# Patient Record
Sex: Female | Born: 1950 | Race: Asian | Hispanic: No | Marital: Single
Health system: Southern US, Community
[De-identification: ages and names within clinical notes are randomized; demographics above are authoritative.]

## PROBLEM LIST (undated history)

## (undated) DIAGNOSIS — I10 Essential (primary) hypertension: Secondary | ICD-10-CM

---

## 2007-11-05 ENCOUNTER — Emergency Department (HOSPITAL_COMMUNITY): Admission: EM | Admit: 2007-11-05 | Discharge: 2007-11-05 | Payer: Self-pay | Admitting: Emergency Medicine

## 2007-11-06 ENCOUNTER — Ambulatory Visit: Payer: Self-pay | Admitting: *Deleted

## 2007-12-30 ENCOUNTER — Ambulatory Visit: Payer: Self-pay | Admitting: Internal Medicine

## 2007-12-30 LAB — CONVERTED CEMR LAB
BUN: 16 mg/dL (ref 6–23)
Chloride: 103 meq/L (ref 96–112)
Creatinine, Ser: 0.59 mg/dL (ref 0.40–1.20)
Glucose, Bld: 93 mg/dL (ref 70–99)

## 2008-04-05 ENCOUNTER — Encounter (INDEPENDENT_AMBULATORY_CARE_PROVIDER_SITE_OTHER): Payer: Self-pay | Admitting: Family Medicine

## 2008-04-05 ENCOUNTER — Ambulatory Visit: Payer: Self-pay | Admitting: Internal Medicine

## 2008-04-27 ENCOUNTER — Ambulatory Visit (HOSPITAL_COMMUNITY): Admission: RE | Admit: 2008-04-27 | Discharge: 2008-04-27 | Payer: Self-pay | Admitting: Internal Medicine

## 2008-05-11 ENCOUNTER — Encounter: Admission: RE | Admit: 2008-05-11 | Discharge: 2008-05-11 | Payer: Self-pay | Admitting: Family Medicine

## 2008-07-05 ENCOUNTER — Ambulatory Visit: Payer: Self-pay | Admitting: Family Medicine

## 2008-07-05 LAB — CONVERTED CEMR LAB
ALT: <8 units/L (ref 0–35)
AST: 20 units/L (ref 0–37)
Albumin: 4.7 g/dL (ref 3.5–5.2)
Alkaline Phosphatase: 58 units/L (ref 39–117)
BUN: 12 mg/dL (ref 6–23)
Basophils Relative: 0 % (ref 0–1)
Calcium: 9.4 mg/dL (ref 8.4–10.5)
Chloride: 104 meq/L (ref 96–112)
Free T4: 1 ng/dL (ref 0.89–1.80)
HDL: 55 mg/dL (ref >39)
LDL Cholesterol: 89 mg/dL (ref 0–99)
MCHC: 32.3 g/dL (ref 30.0–36.0)
Monocytes Relative: 8 % (ref 3–12)
Neutro Abs: 3.5 10*3/uL (ref 1.7–7.7)
Neutrophils Relative %: 69 % (ref 43–77)
Platelets: 215 10*3/uL (ref 150–400)
Potassium: 4.1 meq/L (ref 3.5–5.3)
RBC: 4.7 M/uL (ref 3.87–5.11)
Sodium: 138 meq/L (ref 135–145)
TSH: 1.248 microintl units/mL (ref 0.350–4.50)
Total Protein: 7.7 g/dL (ref 6.0–8.3)
WBC: 5 10*3/uL (ref 4.0–10.5)

## 2008-07-20 ENCOUNTER — Emergency Department (HOSPITAL_COMMUNITY): Admission: EM | Admit: 2008-07-20 | Discharge: 2008-07-20 | Payer: Self-pay | Admitting: Family Medicine

## 2008-08-05 ENCOUNTER — Emergency Department (HOSPITAL_COMMUNITY): Admission: EM | Admit: 2008-08-05 | Discharge: 2008-08-05 | Payer: Self-pay | Admitting: Emergency Medicine

## 2008-10-20 ENCOUNTER — Encounter: Admission: RE | Admit: 2008-10-20 | Discharge: 2008-10-20 | Payer: Self-pay | Admitting: Family Medicine

## 2008-10-25 ENCOUNTER — Ambulatory Visit: Payer: Self-pay | Admitting: Family Medicine

## 2009-04-28 ENCOUNTER — Encounter: Admission: RE | Admit: 2009-04-28 | Discharge: 2009-04-28 | Payer: Self-pay | Admitting: Family Medicine

## 2009-10-18 ENCOUNTER — Encounter: Admission: RE | Admit: 2009-10-18 | Discharge: 2009-10-18 | Payer: Self-pay | Admitting: Family Medicine

## 2010-05-25 ENCOUNTER — Encounter
Admission: RE | Admit: 2010-05-25 | Discharge: 2010-05-25 | Payer: Self-pay | Source: Home / Self Care | Attending: Family Medicine | Admitting: Family Medicine

## 2010-10-02 LAB — DIFFERENTIAL
Lymphocytes Relative: 20 % (ref 12–46)
Monocytes Absolute: 0.6 10*3/uL (ref 0.1–1.0)
Monocytes Relative: 8 % (ref 3–12)
Neutro Abs: 5 10*3/uL (ref 1.7–7.7)
Neutrophils Relative %: 71 % (ref 43–77)

## 2010-10-02 LAB — CBC
Hemoglobin: 12.7 g/dL (ref 12.0–15.0)
RBC: 4.36 MIL/uL (ref 3.87–5.11)
WBC: 7 10*3/uL (ref 4.0–10.5)

## 2011-04-26 ENCOUNTER — Other Ambulatory Visit: Payer: Self-pay | Admitting: Family Medicine

## 2011-04-26 DIAGNOSIS — Z1231 Encounter for screening mammogram for malignant neoplasm of breast: Secondary | ICD-10-CM

## 2011-05-29 ENCOUNTER — Ambulatory Visit
Admission: RE | Admit: 2011-05-29 | Discharge: 2011-05-29 | Disposition: A | Discharge status: Home / Self Care | Payer: Self-pay | Source: Ambulatory Visit | Attending: Family Medicine | Admitting: Family Medicine

## 2011-05-29 DIAGNOSIS — Z1231 Encounter for screening mammogram for malignant neoplasm of breast: Secondary | ICD-10-CM

## 2011-12-30 ENCOUNTER — Encounter (HOSPITAL_COMMUNITY): Payer: Self-pay | Admitting: Emergency Medicine

## 2011-12-30 ENCOUNTER — Emergency Department (HOSPITAL_COMMUNITY)
Admission: EM | Admit: 2011-12-30 | Discharge: 2011-12-30 | Disposition: A | Discharge status: Home / Self Care | Payer: Self-pay | Source: Home / Self Care | Attending: Family Medicine | Admitting: Family Medicine

## 2011-12-30 DIAGNOSIS — L219 Seborrheic dermatitis, unspecified: Secondary | ICD-10-CM

## 2011-12-30 DIAGNOSIS — L218 Other seborrheic dermatitis: Secondary | ICD-10-CM

## 2011-12-30 DIAGNOSIS — H8110 Benign paroxysmal vertigo, unspecified ear: Secondary | ICD-10-CM

## 2011-12-30 HISTORY — DX: Essential (primary) hypertension: I10

## 2011-12-30 LAB — POCT URINALYSIS DIP (DEVICE)
Bilirubin Urine: NEGATIVE
Ketones, ur: NEGATIVE mg/dL
Leukocytes, UA: NEGATIVE
Protein, ur: NEGATIVE mg/dL
Specific Gravity, Urine: 1.015 (ref 1.005–1.030)
pH: 7 (ref 5.0–8.0)

## 2011-12-30 LAB — POCT I-STAT, CHEM 8
BUN: 7 mg/dL (ref 6–23)
Calcium, Ion: 1.27 mmol/L — ABNORMAL HIGH (ref 1.12–1.23)
Chloride: 105 mEq/L (ref 96–112)
Creatinine, Ser: 0.8 mg/dL (ref 0.50–1.10)
Sodium: 142 mEq/L (ref 135–145)
TCO2: 25 mmol/L (ref 0–100)

## 2011-12-30 MED ORDER — SALICYLIC ACID 0.5 % EX SOLN: CUTANEOUS | Status: DC

## 2011-12-30 MED ORDER — MECLIZINE HCL 50 MG PO TABS: 50.0000 mg | ORAL_TABLET | Freq: Three times a day (TID) | ORAL | Status: AC | PRN

## 2011-12-30 MED ORDER — TRIAMCINOLONE ACETONIDE 0.025 % EX LOTN: TOPICAL_LOTION | CUTANEOUS | Status: DC

## 2011-12-30 NOTE — ED Notes (Addendum)
Pt brought here by daughter per vietnamese language with c/o 2 weeks dizziness followed by near faint without pain.states I feel like my body is very weak.denies n/v.hx htn.orthostats neg.

## 2011-12-30 NOTE — ED Provider Notes (Signed)
History     CSN: [id number]  Arrival date & time 12/30/11  1059   First MD Initiated Contact with Patient 12/30/11 1104      Chief Complaint  Patient presents with  . Dizziness    (Consider location/radiation/quality/duration/timing/severity/associated sxs/prior treatment) HPI Comments: 61 year old female with history of high blood pressure. Here complaining of 3 weeks history of episodes of dizziness and room spinning around her associated with heaviness sensation in her ears/head. Denies nausea or vomiting. Reports that had a similar episode 3 years ago that self resolve in few days. Denies any focal extremity weakness.  felt unbalanced during dizziness episodes having to lean on the wall, but denies falls. No polyuria, polydipsia or polyphagia. States that she took her blood pressure medication early this morning does not know the name of the medication. Denies leg cramping. No visual changes above base line for her. Has not taken any medication for her symptoms. Symptoms occurring several times a day (more than 3) in the last 3 weeks. No fever or chills. No low extremity edema. No palpitations or syncope. No shortness of breath or chest pain. Also has itchy rash in scalp that wants to have checked. Intermittent for years.   Past Medical History  Diagnosis Date  . Hypertension     History reviewed. No pertinent past surgical history.  No family history on file.  History  Substance Use Topics  . Smoking status: Not on file  . Smokeless tobacco: Not on file  . Alcohol Use:     OB History    Grav Para Term Preterm Abortions TAB SAB Ect Mult Living                  Review of Systems  Constitutional: Negative for fever, chills, diaphoresis, appetite change, fatigue and unexpected weight change.  HENT: Negative for hearing loss, ear pain, congestion, sore throat, facial swelling, rhinorrhea, sneezing, trouble swallowing, neck pain, neck stiffness, voice change and ear  discharge.   Cardiovascular: Negative for chest pain, palpitations and leg swelling.  Gastrointestinal: Negative for nausea, vomiting, abdominal pain and diarrhea.  Genitourinary: Negative for dysuria and frequency.  Musculoskeletal: Negative for myalgias, joint swelling and arthralgias.  Skin: Positive for rash.  Neurological: Positive for dizziness. Negative for tremors, seizures, syncope, speech difficulty, weakness, numbness and headaches.    Allergies  Review of patient's allergies indicates no known allergies.  Home Medications   Current Outpatient Rx  Name Route Sig Dispense Refill  . MECLIZINE HCL 50 MG PO TABS Oral Take 1 tablet (50 mg total) by mouth 3 (three) times daily as needed. 30 tablet 0  . SALICYLIC ACID 0.5 % EX SOLN  Apply to the scalp as instructed. 1 Bottle 0  . TRIAMCINOLONE ACETONIDE 0.025 % EX LOTN  Apply to scalp as instructed. 1 Bottle 0    BP 119/76  Pulse 83  Temp 98.6 F (37 C) (Oral)  Resp 16  SpO2 98%  Physical Exam  Nursing note and vitals reviewed. Constitutional: She is oriented to person, place, and time. She appears well-developed and well-nourished. No distress.  HENT:  Head: Normocephalic and atraumatic.  Right Ear: External ear normal.  Left Ear: External ear normal.  Nose: Nose normal.  Mouth/Throat: Oropharynx is clear and moist. No oropharyngeal exudate.  Eyes: Conjunctivae and EOM are normal. Pupils are equal, round, and reactive to light. No scleral icterus.  Neck: Normal range of motion. Neck supple. No thyromegaly present.  Cardiovascular: Normal rate, regular rhythm, normal  heart sounds and intact distal pulses.  Exam reveals no gallop and no friction rub.   No murmur heard. Pulmonary/Chest: Effort normal and breath sounds normal. No respiratory distress. She has no wheezes. She has no rales. She exhibits no tenderness.  Abdominal: Soft. Bowel sounds are normal. She exhibits no distension and no mass. There is no tenderness.  There is no rebound and no guarding.  Lymphadenopathy:    She has no cervical adenopathy.  Neurological: She is alert and oriented to person, place, and time. She has normal strength and normal reflexes. No cranial nerve deficit or sensory deficit. She displays a negative Romberg sign. Coordination and gait normal.       No facial or arm drop. Central tongue. Visual fields normal by comparison. Hallpike maneuver elicited symptoms, including short lived horizontal nystagmus.   Skin:       scaly rash in scalp worse in hair line borders and around ears. Consistent with seborrheic dermatitis. No pustules or signs of over infection.    ED Course  Procedures (including critical care time)  Labs Reviewed  POCT I-STAT, CHEM 8 - Abnormal; Notable for the following:    Glucose, Bld 120 (*)     Calcium, Ion 1.27 (*)     All other components within normal limits  POCT URINALYSIS DIP (DEVICE)  LAB REPORT - SCANNED   No results found.   1. Benign positional vertigo   2. Seborrheic dermatitis of scalp       MDM  Normal glucose and electrolytes. Non-orthostatic.  EKG: Normal sinus rhythm. Rate in the 70s. No arrhythmias or ischemic changes. Clinically well. With normal gross neurologic examination. Reassuring exam and symptoms triggered by Hallpike maneuver. Impress positional vertigo. Treated with meclizine. Prescribed salicylic acid and triamcinolone lotion for head rash. Also asked patient to followup with her primary care provider to monitor her symptoms and determine if any further workup is required.         Sharin Grave, MD 01/02/12 [phone]

## 2012-01-06 ENCOUNTER — Telehealth (HOSPITAL_COMMUNITY): Payer: Self-pay | Admitting: *Deleted

## 2012-01-06 NOTE — ED Notes (Signed)
Morrie Sheldon at [facility] pharmacy on [address] called and said they do not carry Salicylic Acid 0.5%.  They can order 6% or they have OTC shampoo that is 3 %.  Discussed with Dr. Tressia Danas when she came in and she said she wants the 6% Salicylic Acid 1 bottle. She thinks it might be 60 ml.  I called Morrie Sheldon back and gave her this information. Vassie Moselle 01/06/2012

## 2014-07-01 ENCOUNTER — Other Ambulatory Visit: Payer: Self-pay

## 2014-07-01 DIAGNOSIS — Z1231 Encounter for screening mammogram for malignant neoplasm of breast: Secondary | ICD-10-CM

## 2014-08-17 ENCOUNTER — Ambulatory Visit
Admission: RE | Admit: 2014-08-17 | Discharge: 2014-08-17 | Disposition: A | Discharge status: Home / Self Care | Payer: No Typology Code available for payment source | Source: Ambulatory Visit

## 2014-08-17 DIAGNOSIS — Z1231 Encounter for screening mammogram for malignant neoplasm of breast: Secondary | ICD-10-CM

## 2015-07-06 ENCOUNTER — Other Ambulatory Visit: Payer: Self-pay

## 2015-07-06 DIAGNOSIS — Z1231 Encounter for screening mammogram for malignant neoplasm of breast: Secondary | ICD-10-CM

## 2015-07-18 ENCOUNTER — Ambulatory Visit
Admission: RE | Admit: 2015-07-18 | Discharge: 2015-07-18 | Disposition: A | Discharge status: Home / Self Care | Payer: No Typology Code available for payment source | Source: Ambulatory Visit

## 2015-07-18 DIAGNOSIS — Z1231 Encounter for screening mammogram for malignant neoplasm of breast: Secondary | ICD-10-CM

## 2016-07-12 ENCOUNTER — Other Ambulatory Visit (HOSPITAL_COMMUNITY): Payer: Self-pay | Admitting: Internal Medicine

## 2016-07-12 ENCOUNTER — Ambulatory Visit (HOSPITAL_COMMUNITY)
Admission: RE | Admit: 2016-07-12 | Discharge: 2016-07-12 | Disposition: A | Discharge status: Home / Self Care | Payer: Self-pay | Source: Ambulatory Visit | Attending: Internal Medicine | Admitting: Internal Medicine

## 2016-07-12 DIAGNOSIS — M5441 Lumbago with sciatica, right side: Secondary | ICD-10-CM | POA: Insufficient documentation

## 2016-07-12 DIAGNOSIS — M4186 Other forms of scoliosis, lumbar region: Secondary | ICD-10-CM | POA: Insufficient documentation

## 2016-07-12 DIAGNOSIS — M5442 Lumbago with sciatica, left side: Secondary | ICD-10-CM

## 2016-07-12 DIAGNOSIS — G8929 Other chronic pain: Secondary | ICD-10-CM

## 2016-07-12 DIAGNOSIS — W19XXXA Unspecified fall, initial encounter: Secondary | ICD-10-CM | POA: Insufficient documentation

## 2016-07-12 DIAGNOSIS — S3992XA Unspecified injury of lower back, initial encounter: Secondary | ICD-10-CM | POA: Insufficient documentation

## 2017-09-05 DIAGNOSIS — H2513 Age-related nuclear cataract, bilateral: Secondary | ICD-10-CM | POA: Diagnosis not present

## 2017-09-05 DIAGNOSIS — H40033 Anatomical narrow angle, bilateral: Secondary | ICD-10-CM | POA: Diagnosis not present

## 2017-10-10 DIAGNOSIS — E785 Hyperlipidemia, unspecified: Secondary | ICD-10-CM | POA: Diagnosis not present

## 2017-10-10 DIAGNOSIS — N898 Other specified noninflammatory disorders of vagina: Secondary | ICD-10-CM | POA: Diagnosis not present

## 2017-10-10 DIAGNOSIS — I10 Essential (primary) hypertension: Secondary | ICD-10-CM | POA: Diagnosis not present

## 2017-10-10 DIAGNOSIS — Z1211 Encounter for screening for malignant neoplasm of colon: Secondary | ICD-10-CM | POA: Diagnosis not present

## 2017-10-10 DIAGNOSIS — E1165 Type 2 diabetes mellitus with hyperglycemia: Secondary | ICD-10-CM | POA: Diagnosis not present

## 2017-10-13 DIAGNOSIS — Z1211 Encounter for screening for malignant neoplasm of colon: Secondary | ICD-10-CM | POA: Diagnosis not present

## 2017-11-20 DIAGNOSIS — E785 Hyperlipidemia, unspecified: Secondary | ICD-10-CM | POA: Diagnosis not present

## 2017-11-20 DIAGNOSIS — L309 Dermatitis, unspecified: Secondary | ICD-10-CM | POA: Diagnosis not present

## 2017-11-20 DIAGNOSIS — K219 Gastro-esophageal reflux disease without esophagitis: Secondary | ICD-10-CM | POA: Diagnosis not present

## 2017-11-20 DIAGNOSIS — I10 Essential (primary) hypertension: Secondary | ICD-10-CM | POA: Diagnosis not present

## 2017-12-25 DIAGNOSIS — E785 Hyperlipidemia, unspecified: Secondary | ICD-10-CM | POA: Diagnosis not present

## 2017-12-25 DIAGNOSIS — Z0000 Encounter for general adult medical examination without abnormal findings: Secondary | ICD-10-CM | POA: Diagnosis not present

## 2017-12-25 DIAGNOSIS — I10 Essential (primary) hypertension: Secondary | ICD-10-CM | POA: Diagnosis not present

## 2017-12-25 DIAGNOSIS — L309 Dermatitis, unspecified: Secondary | ICD-10-CM | POA: Diagnosis not present

## 2017-12-25 DIAGNOSIS — K219 Gastro-esophageal reflux disease without esophagitis: Secondary | ICD-10-CM | POA: Diagnosis not present

## 2018-01-01 DIAGNOSIS — K219 Gastro-esophageal reflux disease without esophagitis: Secondary | ICD-10-CM | POA: Diagnosis not present

## 2018-01-01 DIAGNOSIS — L309 Dermatitis, unspecified: Secondary | ICD-10-CM | POA: Diagnosis not present

## 2018-01-01 DIAGNOSIS — Z23 Encounter for immunization: Secondary | ICD-10-CM | POA: Diagnosis not present

## 2018-01-01 DIAGNOSIS — I10 Essential (primary) hypertension: Secondary | ICD-10-CM | POA: Diagnosis not present

## 2018-01-01 DIAGNOSIS — E785 Hyperlipidemia, unspecified: Secondary | ICD-10-CM | POA: Diagnosis not present

## 2018-03-13 DIAGNOSIS — R29898 Other symptoms and signs involving the musculoskeletal system: Secondary | ICD-10-CM | POA: Diagnosis not present

## 2018-03-13 DIAGNOSIS — Z23 Encounter for immunization: Secondary | ICD-10-CM | POA: Diagnosis not present

## 2018-03-13 DIAGNOSIS — M79644 Pain in right finger(s): Secondary | ICD-10-CM | POA: Diagnosis not present

## 2018-03-13 DIAGNOSIS — M79675 Pain in left toe(s): Secondary | ICD-10-CM | POA: Diagnosis not present

## 2018-03-13 DIAGNOSIS — M79674 Pain in right toe(s): Secondary | ICD-10-CM | POA: Diagnosis not present

## 2018-03-13 DIAGNOSIS — M79645 Pain in left finger(s): Secondary | ICD-10-CM | POA: Diagnosis not present

## 2018-03-13 DIAGNOSIS — R12 Heartburn: Secondary | ICD-10-CM | POA: Diagnosis not present

## 2018-03-19 DIAGNOSIS — M25552 Pain in left hip: Secondary | ICD-10-CM | POA: Diagnosis not present

## 2018-03-19 DIAGNOSIS — M1611 Unilateral primary osteoarthritis, right hip: Secondary | ICD-10-CM | POA: Diagnosis not present

## 2018-03-19 DIAGNOSIS — M19042 Primary osteoarthritis, left hand: Secondary | ICD-10-CM | POA: Diagnosis not present

## 2018-03-19 DIAGNOSIS — M533 Sacrococcygeal disorders, not elsewhere classified: Secondary | ICD-10-CM | POA: Diagnosis not present

## 2018-03-19 DIAGNOSIS — M419 Scoliosis, unspecified: Secondary | ICD-10-CM | POA: Diagnosis not present

## 2018-03-19 DIAGNOSIS — M79671 Pain in right foot: Secondary | ICD-10-CM | POA: Diagnosis not present

## 2018-03-19 DIAGNOSIS — L409 Psoriasis, unspecified: Secondary | ICD-10-CM | POA: Diagnosis not present

## 2018-03-19 DIAGNOSIS — M79642 Pain in left hand: Secondary | ICD-10-CM | POA: Diagnosis not present

## 2018-03-19 DIAGNOSIS — M810 Age-related osteoporosis without current pathological fracture: Secondary | ICD-10-CM | POA: Diagnosis not present

## 2018-03-19 DIAGNOSIS — M064 Inflammatory polyarthropathy: Secondary | ICD-10-CM | POA: Diagnosis not present

## 2018-03-19 DIAGNOSIS — M79643 Pain in unspecified hand: Secondary | ICD-10-CM | POA: Diagnosis not present

## 2018-03-19 DIAGNOSIS — M19041 Primary osteoarthritis, right hand: Secondary | ICD-10-CM | POA: Diagnosis not present

## 2018-03-19 DIAGNOSIS — M8580 Other specified disorders of bone density and structure, unspecified site: Secondary | ICD-10-CM | POA: Diagnosis not present

## 2018-03-19 DIAGNOSIS — M79641 Pain in right hand: Secondary | ICD-10-CM | POA: Diagnosis not present

## 2018-03-19 DIAGNOSIS — M19072 Primary osteoarthritis, left ankle and foot: Secondary | ICD-10-CM | POA: Diagnosis not present

## 2018-03-19 DIAGNOSIS — E119 Type 2 diabetes mellitus without complications: Secondary | ICD-10-CM | POA: Diagnosis not present

## 2018-03-19 DIAGNOSIS — M1612 Unilateral primary osteoarthritis, left hip: Secondary | ICD-10-CM | POA: Diagnosis not present

## 2018-03-19 DIAGNOSIS — M19071 Primary osteoarthritis, right ankle and foot: Secondary | ICD-10-CM | POA: Diagnosis not present

## 2018-03-19 DIAGNOSIS — L4050 Arthropathic psoriasis, unspecified: Secondary | ICD-10-CM | POA: Diagnosis not present

## 2018-03-20 DIAGNOSIS — M06871 Other specified rheumatoid arthritis, right ankle and foot: Secondary | ICD-10-CM | POA: Diagnosis not present

## 2018-03-20 DIAGNOSIS — M06872 Other specified rheumatoid arthritis, left ankle and foot: Secondary | ICD-10-CM | POA: Diagnosis not present

## 2018-04-28 DIAGNOSIS — E119 Type 2 diabetes mellitus without complications: Secondary | ICD-10-CM | POA: Diagnosis not present

## 2018-04-28 DIAGNOSIS — M810 Age-related osteoporosis without current pathological fracture: Secondary | ICD-10-CM | POA: Diagnosis not present

## 2018-04-28 DIAGNOSIS — M064 Inflammatory polyarthropathy: Secondary | ICD-10-CM | POA: Diagnosis not present

## 2018-04-28 DIAGNOSIS — M79671 Pain in right foot: Secondary | ICD-10-CM | POA: Diagnosis not present

## 2018-04-28 DIAGNOSIS — M47816 Spondylosis without myelopathy or radiculopathy, lumbar region: Secondary | ICD-10-CM | POA: Diagnosis not present

## 2018-04-28 DIAGNOSIS — M79643 Pain in unspecified hand: Secondary | ICD-10-CM | POA: Diagnosis not present

## 2018-04-28 DIAGNOSIS — L409 Psoriasis, unspecified: Secondary | ICD-10-CM | POA: Diagnosis not present

## 2018-04-28 DIAGNOSIS — L4050 Arthropathic psoriasis, unspecified: Secondary | ICD-10-CM | POA: Diagnosis not present

## 2018-04-28 DIAGNOSIS — M419 Scoliosis, unspecified: Secondary | ICD-10-CM | POA: Diagnosis not present

## 2018-04-28 DIAGNOSIS — M549 Dorsalgia, unspecified: Secondary | ICD-10-CM | POA: Diagnosis not present

## 2018-05-05 DIAGNOSIS — R231 Pallor: Secondary | ICD-10-CM | POA: Diagnosis not present

## 2018-05-05 DIAGNOSIS — R000 Tachycardia, unspecified: Secondary | ICD-10-CM | POA: Diagnosis not present

## 2018-05-05 DIAGNOSIS — M810 Age-related osteoporosis without current pathological fracture: Secondary | ICD-10-CM | POA: Diagnosis not present

## 2018-05-05 DIAGNOSIS — R079 Chest pain, unspecified: Secondary | ICD-10-CM | POA: Diagnosis not present

## 2018-05-05 DIAGNOSIS — R0602 Shortness of breath: Secondary | ICD-10-CM | POA: Diagnosis not present

## 2018-05-05 DIAGNOSIS — R0789 Other chest pain: Secondary | ICD-10-CM | POA: Diagnosis not present

## 2018-05-06 ENCOUNTER — Emergency Department (HOSPITAL_COMMUNITY): Payer: Medicare Other

## 2018-05-06 ENCOUNTER — Observation Stay (HOSPITAL_BASED_OUTPATIENT_CLINIC_OR_DEPARTMENT_OTHER): Payer: Medicare Other

## 2018-05-06 ENCOUNTER — Encounter (HOSPITAL_COMMUNITY): Payer: Self-pay | Admitting: Emergency Medicine

## 2018-05-06 ENCOUNTER — Observation Stay (HOSPITAL_COMMUNITY)
Admission: EM | Admit: 2018-05-06 | Discharge: 2018-05-07 | Disposition: A | Discharge status: Home / Self Care | Payer: Medicare Other | Attending: Internal Medicine | Admitting: Internal Medicine

## 2018-05-06 DIAGNOSIS — A419 Sepsis, unspecified organism: Secondary | ICD-10-CM

## 2018-05-06 DIAGNOSIS — R0602 Shortness of breath: Secondary | ICD-10-CM

## 2018-05-06 DIAGNOSIS — R071 Chest pain on breathing: Secondary | ICD-10-CM | POA: Diagnosis not present

## 2018-05-06 DIAGNOSIS — I10 Essential (primary) hypertension: Secondary | ICD-10-CM | POA: Insufficient documentation

## 2018-05-06 DIAGNOSIS — R0789 Other chest pain: Principal | ICD-10-CM | POA: Insufficient documentation

## 2018-05-06 DIAGNOSIS — Z791 Long term (current) use of non-steroidal anti-inflammatories (NSAID): Secondary | ICD-10-CM | POA: Diagnosis not present

## 2018-05-06 DIAGNOSIS — R509 Fever, unspecified: Secondary | ICD-10-CM | POA: Diagnosis not present

## 2018-05-06 DIAGNOSIS — Z79899 Other long term (current) drug therapy: Secondary | ICD-10-CM | POA: Diagnosis not present

## 2018-05-06 DIAGNOSIS — R05 Cough: Secondary | ICD-10-CM | POA: Insufficient documentation

## 2018-05-06 DIAGNOSIS — R000 Tachycardia, unspecified: Secondary | ICD-10-CM | POA: Insufficient documentation

## 2018-05-06 DIAGNOSIS — I361 Nonrheumatic tricuspid (valve) insufficiency: Secondary | ICD-10-CM

## 2018-05-06 DIAGNOSIS — R079 Chest pain, unspecified: Secondary | ICD-10-CM | POA: Diagnosis present

## 2018-05-06 LAB — URINALYSIS, ROUTINE W REFLEX MICROSCOPIC
BILIRUBIN URINE: NEGATIVE
Glucose, UA: NEGATIVE mg/dL
HGB URINE DIPSTICK: NEGATIVE
Ketones, ur: NEGATIVE mg/dL
Leukocytes, UA: NEGATIVE
Nitrite: NEGATIVE
PH: 8 (ref 5.0–8.0)
Protein, ur: NEGATIVE mg/dL
SPECIFIC GRAVITY, URINE: 1.001 — AB (ref 1.005–1.030)

## 2018-05-06 LAB — CBC WITH DIFFERENTIAL/PLATELET
ABS IMMATURE GRANULOCYTES: 0.07 10*3/uL (ref 0.00–0.07)
Basophils Absolute: 0 10*3/uL (ref 0.0–0.1)
Basophils Relative: 0 %
EOS ABS: 0 10*3/uL (ref 0.0–0.5)
Eosinophils Relative: 0 %
HCT: 37.6 % (ref 36.0–46.0)
Hemoglobin: 12 g/dL (ref 12.0–15.0)
Immature Granulocytes: 1 %
Lymphocytes Relative: 4 %
Lymphs Abs: 0.5 10*3/uL — ABNORMAL LOW (ref 0.7–4.0)
MCH: 26.9 pg (ref 26.0–34.0)
MCHC: 31.9 g/dL (ref 30.0–36.0)
MCV: 84.3 fL (ref 80.0–100.0)
MONO ABS: 0.3 10*3/uL (ref 0.1–1.0)
MONOS PCT: 2 %
NEUTROS ABS: 12.7 10*3/uL — AB (ref 1.7–7.7)
NEUTROS PCT: 93 %
Platelets: 206 10*3/uL (ref 150–400)
RBC: 4.46 MIL/uL (ref 3.87–5.11)
RDW: 13.7 % (ref 11.5–15.5)
WBC: 13.7 10*3/uL — AB (ref 4.0–10.5)
nRBC: 0 % (ref 0.0–0.2)

## 2018-05-06 LAB — RESPIRATORY PANEL BY PCR
ADENOVIRUS-RVPPCR: NOT DETECTED
Bordetella pertussis: NOT DETECTED
CORONAVIRUS 229E-RVPPCR: NOT DETECTED
CORONAVIRUS HKU1-RVPPCR: NOT DETECTED
CORONAVIRUS NL63-RVPPCR: NOT DETECTED
CORONAVIRUS OC43-RVPPCR: NOT DETECTED
Chlamydophila pneumoniae: NOT DETECTED
Influenza A: NOT DETECTED
Influenza B: NOT DETECTED
Metapneumovirus: NOT DETECTED
Mycoplasma pneumoniae: NOT DETECTED
PARAINFLUENZA VIRUS 1-RVPPCR: NOT DETECTED
Parainfluenza Virus 2: NOT DETECTED
Parainfluenza Virus 3: NOT DETECTED
Parainfluenza Virus 4: NOT DETECTED
Respiratory Syncytial Virus: NOT DETECTED
Rhinovirus / Enterovirus: NOT DETECTED

## 2018-05-06 LAB — I-STAT TROPONIN, ED: Troponin i, poc: 0 ng/mL (ref 0.00–0.08)

## 2018-05-06 LAB — COMPREHENSIVE METABOLIC PANEL
ALT: 9 U/L (ref 0–44)
ANION GAP: 8 (ref 5–15)
AST: 24 U/L (ref 15–41)
Albumin: 4 g/dL (ref 3.5–5.0)
Alkaline Phosphatase: 63 U/L (ref 38–126)
BUN: 12 mg/dL (ref 8–23)
CO2: 22 mmol/L (ref 22–32)
Calcium: 9.2 mg/dL (ref 8.9–10.3)
Chloride: 106 mmol/L (ref 98–111)
Creatinine, Ser: 0.77 mg/dL (ref 0.44–1.00)
Glucose, Bld: 110 mg/dL — ABNORMAL HIGH (ref 70–99)
POTASSIUM: 3.8 mmol/L (ref 3.5–5.1)
SODIUM: 136 mmol/L (ref 135–145)
TOTAL PROTEIN: 7 g/dL (ref 6.5–8.1)
Total Bilirubin: 0.7 mg/dL (ref 0.3–1.2)

## 2018-05-06 LAB — BRAIN NATRIURETIC PEPTIDE: B NATRIURETIC PEPTIDE 5: 33 pg/mL (ref 0.0–100.0)

## 2018-05-06 LAB — INFLUENZA PANEL BY PCR (TYPE A & B)
INFLAPCR: NEGATIVE
Influenza B By PCR: NEGATIVE

## 2018-05-06 LAB — I-STAT CG4 LACTIC ACID, ED: Lactic Acid, Venous: 2.62 mmol/L (ref 0.5–1.9)

## 2018-05-06 LAB — PROCALCITONIN: Procalcitonin: <0.1 ng/mL

## 2018-05-06 LAB — TROPONIN I
Troponin I: <0.03 ng/mL (ref <0.03)
Troponin I: <0.03 ng/mL (ref <0.03)

## 2018-05-06 LAB — LACTIC ACID, PLASMA
LACTIC ACID, VENOUS: 1.4 mmol/L (ref 0.5–1.9)
Lactic Acid, Venous: 1.2 mmol/L (ref 0.5–1.9)

## 2018-05-06 LAB — D-DIMER, QUANTITATIVE: D-Dimer, Quant: <0.27 ug/mL-FEU (ref 0.00–0.50)

## 2018-05-06 LAB — ECHOCARDIOGRAM COMPLETE: Weight: 1832 oz

## 2018-05-06 MED ORDER — MELOXICAM 7.5 MG PO TABS
15.0000 mg | ORAL_TABLET | Freq: Every day | ORAL | Status: DC
  Administered 2018-05-06 – 2018-05-07 (×2): 15 mg via ORAL
  Filled 2018-05-06 (×2): qty 2

## 2018-05-06 MED ORDER — ONDANSETRON HCL 4 MG/2ML IJ SOLN: 4.0000 mg | Freq: Four times a day (QID) | INTRAMUSCULAR | Status: DC | PRN

## 2018-05-06 MED ORDER — SODIUM CHLORIDE 0.9 % IV SOLN
INTRAVENOUS | Status: DC
  Administered 2018-05-06 – 2018-05-07 (×2): via INTRAVENOUS

## 2018-05-06 MED ORDER — ACETAMINOPHEN 325 MG PO TABS
650.0000 mg | ORAL_TABLET | ORAL | Status: DC | PRN
  Administered 2018-05-06 – 2018-05-07 (×3): 650 mg via ORAL
  Filled 2018-05-06 (×3): qty 2

## 2018-05-06 MED ORDER — ACETAMINOPHEN 325 MG PO TABS
650.0000 mg | ORAL_TABLET | Freq: Once | ORAL | Status: AC
  Administered 2018-05-06: 650 mg via ORAL
  Filled 2018-05-06: qty 2

## 2018-05-06 MED ORDER — SODIUM CHLORIDE 0.9 % IV BOLUS
1000.0000 mL | Freq: Once | INTRAVENOUS | Status: AC
  Administered 2018-05-06: 1000 mL via INTRAVENOUS

## 2018-05-06 MED ORDER — PANTOPRAZOLE SODIUM 40 MG PO TBEC
40.0000 mg | DELAYED_RELEASE_TABLET | Freq: Every day | ORAL | Status: DC
  Administered 2018-05-06 – 2018-05-07 (×2): 40 mg via ORAL
  Filled 2018-05-06 (×2): qty 1

## 2018-05-06 MED ORDER — PRAVASTATIN SODIUM 20 MG PO TABS
20.0000 mg | ORAL_TABLET | Freq: Every day | ORAL | Status: DC
  Administered 2018-05-06: 20 mg via ORAL
  Filled 2018-05-06: qty 1
  Filled 2018-05-06: qty 2
  Filled 2018-05-06: qty 1

## 2018-05-06 MED ORDER — NITROGLYCERIN 0.4 MG SL SUBL
0.4000 mg | SUBLINGUAL_TABLET | SUBLINGUAL | Status: DC | PRN
  Filled 2018-05-06: qty 1

## 2018-05-06 MED ORDER — BENZONATATE 100 MG PO CAPS
100.0000 mg | ORAL_CAPSULE | Freq: Three times a day (TID) | ORAL | Status: DC | PRN
  Administered 2018-05-06: 100 mg via ORAL
  Filled 2018-05-06: qty 1

## 2018-05-06 MED ORDER — VANCOMYCIN HCL IN DEXTROSE 1-5 GM/200ML-% IV SOLN
1000.0000 mg | Freq: Once | INTRAVENOUS | Status: DC
  Filled 2018-05-06: qty 200

## 2018-05-06 MED ORDER — LISINOPRIL 20 MG PO TABS
20.0000 mg | ORAL_TABLET | Freq: Every day | ORAL | Status: DC
  Administered 2018-05-06 – 2018-05-07 (×2): 20 mg via ORAL
  Filled 2018-05-06 (×2): qty 1

## 2018-05-06 MED ORDER — METRONIDAZOLE IN NACL 5-0.79 MG/ML-% IV SOLN
500.0000 mg | Freq: Three times a day (TID) | INTRAVENOUS | Status: DC
  Administered 2018-05-06: 500 mg via INTRAVENOUS
  Filled 2018-05-06: qty 100

## 2018-05-06 MED ORDER — SODIUM CHLORIDE 0.9 % IV SOLN
2.0000 g | Freq: Once | INTRAVENOUS | Status: AC
  Administered 2018-05-06: 2 g via INTRAVENOUS
  Filled 2018-05-06: qty 2

## 2018-05-06 MED ORDER — ACETAMINOPHEN 325 MG PO TABS: 650.0000 mg | ORAL_TABLET | Freq: Four times a day (QID) | ORAL | Status: DC | PRN

## 2018-05-06 MED ORDER — SODIUM CHLORIDE 0.9 % IV SOLN
500.0000 mg | INTRAVENOUS | Status: DC
  Administered 2018-05-06 – 2018-05-07 (×2): 500 mg via INTRAVENOUS
  Filled 2018-05-06 (×2): qty 500

## 2018-05-06 NOTE — ED Notes (Signed)
Elevated Cg-4 reported to Lisa-PA

## 2018-05-06 NOTE — H&P (Signed)
History and Physical    Vicki Recordshao T Hallowell ZOX:[id number] DOB: 09/25/1950 DOA: 05/06/2018  [facility]  Patient coming from: Home  Chief Complaint: Chest pain shortness of breath  HPI: Vicki Vang is a 67 y.o. female with medical history significant of hypertension comes in with 1 day of shortness of breath cough and chest pain.  Patient reports chest pain is pleuritic in nature.  She speaks some English a lot of the history is obtained from her husband.  She has not had any nausea vomiting or diarrhea.  She has not had any recent traveling in over a year.  She denies any recent injuries any broken bones or any significant illnesses recently.  She is on some sort of infusion for her arthritis by a rheumatologist.  Unclear what infusion she is on so she may be immunocompromised.  Patient is being referred for admission for her chest pain and fever with a lactic acid level of 2.6.  Patient appears nontoxic.  Her influenza is pending.  No rashes.  No diarrhea.  Review of Systems: As per HPI otherwise 10 point review of systems negative.   Past Medical History:  Diagnosis Date  . Hypertension     History reviewed. No pertinent surgical history.  None   has no tobacco, alcohol, and drug history on file.  Negative x3  No Known Allergies  No family history on file.  No premature coronary disease  Prior to Admission medications   Medication Sig Start Date End Date Taking? Authorizing Provider  acetaminophen (TYLENOL) 325 MG tablet Take 650 mg by mouth every 6 (six) hours as needed for mild pain.   Yes [provider]  alendronate (FOSAMAX) 70 MG tablet Take 70 mg by mouth once a week.  03/24/18  Yes [provider]  lisinopril (PRINIVIL,ZESTRIL) 20 MG tablet Take 20 mg by mouth daily. 04/25/18  Yes [provider]  meloxicam (MOBIC) 15 MG tablet Take 15 mg by mouth daily. 04/28/18  Yes [provider]  pantoprazole (PROTONIX) 40 MG tablet Take 40  mg by mouth daily. 03/13/18  Yes [provider]  pravastatin (PRAVACHOL) 20 MG tablet Take 20 mg by mouth daily. 03/08/18  Yes [provider]  Salicylic Acid 0.5 % SOLN Apply to the scalp as instructed. Patient not taking: Reported on 05/06/2018 12/30/11   Moreno-Coll, Adlih, MD  Triamcinolone Acetonide 0.025 % LOTN Apply to scalp as instructed. Patient not taking: Reported on 05/06/2018 12/30/11   Sharin GraveMoreno-Coll, Adlih, MD    Physical Exam: Vitals:   05/06/18 0100 05/06/18 0115 05/06/18 0200 05/06/18 0215  BP: 121/87 119/82 115/82 132/84  Pulse: (!) 103 (!) 102 (!) 102 (!) 112  Resp: 17 18 15 19   Temp:      TempSrc:      SpO2: 99% 99% 97% 100%      Constitutional: NAD, calm, comfortable Vitals:   05/06/18 0100 05/06/18 0115 05/06/18 0200 05/06/18 0215  BP: 121/87 119/82 115/82 132/84  Pulse: (!) 103 (!) 102 (!) 102 (!) 112  Resp: 17 18 15 19   Temp:      TempSrc:      SpO2: 99% 99% 97% 100%   Eyes: PERRL, lids and conjunctivae normal ENMT: Mucous membranes are moist. Posterior pharynx clear of any exudate or lesions.Normal dentition.  Neck: normal, supple, no masses, no thyromegaly Respiratory: clear to auscultation bilaterally, no wheezing, no crackles. Normal respiratory effort. No accessory muscle use.  Cardiovascular: Regular rate and rhythm, no  murmurs / rubs / gallops. No extremity edema. 2+ pedal pulses. No carotid bruits.  Abdomen: no tenderness, no masses palpated. No hepatosplenomegaly. Bowel sounds positive.  Musculoskeletal: no clubbing / cyanosis. No joint deformity upper and lower extremities. Good ROM, no contractures. Normal muscle tone.  Skin: no rashes, lesions, ulcers. No induration Neurologic: CN 2-12 grossly intact. Sensation intact, DTR normal. Strength 5/5 in all 4.  Psychiatric: Normal judgment and insight. Alert and oriented x 3. Normal mood.    Labs on Admission: I have personally reviewed following labs and imaging  studies  CBC: Recent Labs  Lab 05/06/18 0055  WBC 13.7*  NEUTROABS 12.7*  HGB 12.0  HCT 37.6  MCV 84.3  PLT 206   Basic Metabolic Panel: Recent Labs  Lab 05/06/18 0055  NA 136  K 3.8  CL 106  CO2 22  GLUCOSE 110*  BUN 12  CREATININE 0.77  CALCIUM 9.2   GFR: CrCl cannot be calculated (Unknown ideal weight.). Liver Function Tests: Recent Labs  Lab 05/06/18 0055  AST 24  ALT 9  ALKPHOS 63  BILITOT 0.7  PROT 7.0  ALBUMIN 4.0   No results for input(s): LIPASE, AMYLASE in the last 168 hours. No results for input(s): AMMONIA in the last 168 hours. Coagulation Profile: No results for input(s): INR, PROTIME in the last 168 hours. Cardiac Enzymes: No results for input(s): CKTOTAL, CKMB, CKMBINDEX, TROPONINI in the last 168 hours. BNP (last 3 results) No results for input(s): PROBNP in the last 8760 hours. HbA1C: No results for input(s): HGBA1C in the last 72 hours. CBG: No results for input(s): GLUCAP in the last 168 hours. Lipid Profile: No results for input(s): CHOL, HDL, LDLCALC, TRIG, CHOLHDL, LDLDIRECT in the last 72 hours. Thyroid Function Tests: No results for input(s): TSH, T4TOTAL, FREET4, T3FREE, THYROIDAB in the last 72 hours. Anemia Panel: No results for input(s): VITAMINB12, FOLATE, FERRITIN, TIBC, IRON, RETICCTPCT in the last 72 hours. Urine analysis:    Component Value Date/Time   COLORURINE COLORLESS (A) 05/06/2018 0135   APPEARANCEUR CLEAR 05/06/2018 0135   LABSPEC 1.001 (L) 05/06/2018 0135   PHURINE 8.0 05/06/2018 0135   GLUCOSEU NEGATIVE 05/06/2018 0135   HGBUR NEGATIVE 05/06/2018 0135   BILIRUBINUR NEGATIVE 05/06/2018 0135   KETONESUR NEGATIVE 05/06/2018 0135   PROTEINUR NEGATIVE 05/06/2018 0135   UROBILINOGEN 0.2 12/30/2011 1154   NITRITE NEGATIVE 05/06/2018 0135   LEUKOCYTESUR NEGATIVE 05/06/2018 0135   Sepsis Labs: !!!!!!!!!!!!!!!!!!!!!!!!!!!!!!!!!!!!!!!!!!!! @LABRCNTIP (procalcitonin:4,lacticidven:4) )No results found for this or  any previous visit (from the past 240 hour(s)).   Radiological Exams on Admission: Dg Chest 2 View  Result Date: 05/06/2018 CLINICAL DATA:  Fever.  Shortness of breath EXAM: CHEST - 2 VIEW COMPARISON:  July 20, 2008 FINDINGS: The heart size and mediastinal contours are within normal limits. Both lungs are clear. The visualized skeletal structures are unremarkable. IMPRESSION: No active cardiopulmonary disease. Electronically Signed   By: Gerome Sam III M.D   On: 05/06/2018 01:58    EKG: Independently reviewed.  Sinus tachycardia rate of 112 with no acute changes Chest x-ray reviewed no edema or infiltrate Old chart reviewed Case discussed with EDP  Assessment/Plan 67 year old female with fever chest pain and shortness of breath of unclear etiology  Principal Problem:   Chest pain-chest pain very atypical serial cardiac enzymes.  Obtain cardiac echo in the morning.  This is likely all due to viral illness.  Patient nontoxic.  Active Problems:   SOB (shortness of breath)-with mild fever.  Check a  quick influenza.  Also placed on azithromycin for now.  Check procalcitonin level.  Check up on blood cultures.     DVT prophylaxis: SCDs Code Status: Full Family Communication: Husband Disposition Plan: Tomorrow Consults called: None Admission status: Observation   Victorino Fatzinger A MD [facility]  If 7PM-7AM, please contact night-coverage [email or web address] Password [email or web address]  05/06/2018, 3:04 AM

## 2018-05-06 NOTE — Progress Notes (Signed)
PROGRESS NOTE    Vicki Vang  WUJ:[id number] DOB: 06-29-1950 DOA: 05/06/2018 [facility]: [facility]    Brief Narrative:  67 y.o. female with medical history significant of hypertension comes in with 1 day of shortness of breath cough and chest pain.  Patient reports chest pain is pleuritic in nature.  She speaks some English a lot of the history is obtained from her husband.  She has not had any nausea vomiting or diarrhea.  She has not had any recent traveling in over a year.  She denies any recent injuries any broken bones or any significant illnesses recently.  She is on some sort of infusion for her arthritis by a rheumatologist.  Unclear what infusion she is on so she may be immunocompromised.  Patient is being referred for admission for her chest pain and fever with a lactic acid level of 2.6.   Assessment & Plan:   Principal Problem:   Chest pain Active Problems:   SOB (shortness of breath)  Principal Problem:   Chest pain-chest pain -atypical in nature -Serial trop neg x 4 -2d echo performed with no WMA and normal LVEF -Suspect chest pain related to chest wall pain -Continue analgesics as needed  Active Problems:   SOB (shortness of breath)-with mild fever, likely viral URI -flu neg -Ordered and reviewed viral panel, negative -Clinically improving -Overnight fevers and continued tachycardia overnight, thus will benefit from continued  IVF hydration -Currently continued on azithromycin as ordered  HTN -BP currently stable -Cont to monitor  DVT prophylaxis: SCD's Code Status: Full Family Communication: Pt in room, family at bedside Disposition Plan: Possible home in 24hrs  Consultants:     Procedures:     Antimicrobials: Anti-infectives (From admission, onward)   Start     Dose/Rate Route Frequency Ordered Stop   05/06/18 0800  azithromycin (ZITHROMAX) 500 mg in sodium chloride 0.9 % 250 mL IVPB     500 mg 250 mL/hr over 60 Minutes Intravenous  Every 24 hours 05/06/18 0309     05/06/18 0215  ceFEPIme (MAXIPIME) 2 g in sodium chloride 0.9 % 100 mL IVPB     2 g 200 mL/hr over 30 Minutes Intravenous  Once 05/06/18 0214 05/06/18 0325   05/06/18 0215  metroNIDAZOLE (FLAGYL) IVPB 500 mg  Status:  Discontinued     500 mg 100 mL/hr over 60 Minutes Intravenous Every 8 hours 05/06/18 0214 05/06/18 0308   05/06/18 0215  vancomycin (VANCOCIN) IVPB 1000 mg/200 mL premix  Status:  Discontinued     1,000 mg 200 mL/hr over 60 Minutes Intravenous  Once 05/06/18 0214 05/06/18 0308       Subjective: Feeling somewhat better   Objective: Vitals:   05/06/18 0344 05/06/18 0406 05/06/18 0600 05/06/18 1240  BP:  114/68  129/76  Pulse:  (!) 117  99  Resp:  18    Temp: 99 F (37.2 C) (!) 101.1 F (38.4 C) 98.4 F (36.9 C) 98.5 F (36.9 C)  TempSrc: Oral Oral Oral Oral  SpO2:  99%  99%  Weight:  51.9 kg      Intake/Output Summary (Last 24 hours) at 05/06/2018 1653 Last data filed at 05/06/2018 1217 Gross per 24 hour  Intake 1591 ml  Output -  Net 1591 ml   Filed Weights   05/06/18 0406  Weight: 51.9 kg    Examination:  General exam: Appears calm and comfortable  Respiratory system: Clear to auscultation. Respiratory effort normal. Cardiovascular system: S1 & S2  heard, RRR. Gastrointestinal system: Abdomen is nondistended, soft and nontender. No organomegaly or masses felt. Normal bowel sounds heard. Central nervous system: Alert and oriented. No focal neurological deficits. Extremities: Symmetric 5 x 5 power. Skin: No rashes, lesions  Psychiatry: Judgement and insight appear normal. Mood & affect appropriate.   Data Reviewed: I have personally reviewed following labs and imaging studies  CBC: Recent Labs  Lab 05/06/18 0055  WBC 13.7*  NEUTROABS 12.7*  HGB 12.0  HCT 37.6  MCV 84.3  PLT 206   Basic Metabolic Panel: Recent Labs  Lab 05/06/18 0055  NA 136  K 3.8  CL 106  CO2 22  GLUCOSE 110*  BUN 12    CREATININE 0.77  CALCIUM 9.2   GFR: CrCl cannot be calculated (Unknown ideal weight.). Liver Function Tests: Recent Labs  Lab 05/06/18 0055  AST 24  ALT 9  ALKPHOS 63  BILITOT 0.7  PROT 7.0  ALBUMIN 4.0   No results for input(s): LIPASE, AMYLASE in the last 168 hours. No results for input(s): AMMONIA in the last 168 hours. Coagulation Profile: No results for input(s): INR, PROTIME in the last 168 hours. Cardiac Enzymes: Recent Labs  Lab 05/06/18 0318 05/06/18 0529 05/06/18 0949 05/06/18 1305  TROPONINI <0.03 <0.03 <0.03 <0.03   BNP (last 3 results) No results for input(s): PROBNP in the last 8760 hours. HbA1C: No results for input(s): HGBA1C in the last 72 hours. CBG: No results for input(s): GLUCAP in the last 168 hours. Lipid Profile: No results for input(s): CHOL, HDL, LDLCALC, TRIG, CHOLHDL, LDLDIRECT in the last 72 hours. Thyroid Function Tests: No results for input(s): TSH, T4TOTAL, FREET4, T3FREE, THYROIDAB in the last 72 hours. Anemia Panel: No results for input(s): VITAMINB12, FOLATE, FERRITIN, TIBC, IRON, RETICCTPCT in the last 72 hours. Sepsis Labs: Recent Labs  Lab 05/06/18 0103 05/06/18 0318 05/06/18 0346 05/06/18 0529  PROCALCITON  --  <0.10  --   --   LATICACIDVEN 2.62*  --  1.2 1.4    Recent Results (from the past 240 hour(s))  Respiratory Panel by PCR     Status: None   Collection Time: 05/06/18 10:30 AM  Result Value Ref Range Status   Adenovirus NOT DETECTED NOT DETECTED Final   Coronavirus 229E NOT DETECTED NOT DETECTED Final   Coronavirus HKU1 NOT DETECTED NOT DETECTED Final   Coronavirus NL63 NOT DETECTED NOT DETECTED Final   Coronavirus OC43 NOT DETECTED NOT DETECTED Final   Metapneumovirus NOT DETECTED NOT DETECTED Final   Rhinovirus / Enterovirus NOT DETECTED NOT DETECTED Final   Influenza A NOT DETECTED NOT DETECTED Final   Influenza B NOT DETECTED NOT DETECTED Final   Parainfluenza Virus 1 NOT DETECTED NOT DETECTED Final    Parainfluenza Virus 2 NOT DETECTED NOT DETECTED Final   Parainfluenza Virus 3 NOT DETECTED NOT DETECTED Final   Parainfluenza Virus 4 NOT DETECTED NOT DETECTED Final   Respiratory Syncytial Virus NOT DETECTED NOT DETECTED Final   Bordetella pertussis NOT DETECTED NOT DETECTED Final   Chlamydophila pneumoniae NOT DETECTED NOT DETECTED Final   Mycoplasma pneumoniae NOT DETECTED NOT DETECTED Final    Comment: Performed at [facility], [address] [place], [place] [address]     Radiology Studies: Dg Chest 2 View  Result Date: 05/06/2018 CLINICAL DATA:  Fever.  Shortness of breath EXAM: CHEST - 2 VIEW COMPARISON:  July 20, 2008 FINDINGS: The heart size and mediastinal contours are within normal limits. Both lungs are clear. The visualized  skeletal structures are unremarkable. IMPRESSION: No active cardiopulmonary disease. Electronically Signed   By: Gerome Sam III M.D   On: 05/06/2018 01:58    Scheduled Meds: . lisinopril  20 mg Oral Daily  . meloxicam  15 mg Oral Daily  . pantoprazole  40 mg Oral Daily  . pravastatin  20 mg Oral q1800   Continuous Infusions: . sodium chloride 100 mL/hr at 05/06/18 0457  . azithromycin 500 mg (05/06/18 1217)     LOS: 0 days   Rickey Barbara, MD [facility] Pager On Amion  If 7PM-7AM, please contact night-coverage 05/06/2018, 4:53 PM

## 2018-05-06 NOTE — Progress Notes (Signed)
  Echocardiogram 2D Echocardiogram has been performed.  Chantz Montefusco G Eyleen Rawlinson 05/06/2018, 9:31 AM

## 2018-05-06 NOTE — ED Notes (Signed)
Informed MD of elevated lactic

## 2018-05-06 NOTE — ED Notes (Signed)
Patient transported to X-ray 

## 2018-05-06 NOTE — ED Provider Notes (Signed)
[facility] Provider Note   CSN: [id number] Arrival date & time: 05/06/18  0007     History   Chief Complaint Chief Complaint  Patient presents with  . Chest Pain  . Shortness of Breath    HPI Rica Recordshao T Raven is a 67 y.o. female.  Patient presents to the emergency department by ambulance with complaints of shortness of breath.  Patient reports onset of symptoms at 5 PM.  She did have a slight cough for 1 day, no sputum production.  No nausea, vomiting or diarrhea.  Patient reports that the pain is constant but worsens when she takes a deep breath.  She has not noticed any chest tenderness.     Past Medical History:  Diagnosis Date  . Hypertension     There are no active problems to display for this patient.   History reviewed. No pertinent surgical history.   OB History   None      Home Medications    Prior to Admission medications   Medication Sig Start Date End Date Taking? Authorizing Provider  acetaminophen (TYLENOL) 325 MG tablet Take 650 mg by mouth every 6 (six) hours as needed for mild pain.   Yes [provider]  alendronate (FOSAMAX) 70 MG tablet Take 70 mg by mouth once a week.  03/24/18  Yes [provider]  lisinopril (PRINIVIL,ZESTRIL) 20 MG tablet Take 20 mg by mouth daily. 04/25/18  Yes [provider]  meloxicam (MOBIC) 15 MG tablet Take 15 mg by mouth daily. 04/28/18  Yes [provider]  pantoprazole (PROTONIX) 40 MG tablet Take 40 mg by mouth daily. 03/13/18  Yes [provider]  pravastatin (PRAVACHOL) 20 MG tablet Take 20 mg by mouth daily. 03/08/18  Yes [provider]  Salicylic Acid 0.5 % SOLN Apply to the scalp as instructed. Patient not taking: Reported on 05/06/2018 12/30/11   Moreno-Coll, Adlih, MD  Triamcinolone Acetonide 0.025 % LOTN Apply to scalp as instructed. Patient not taking: Reported on 05/06/2018 12/30/11   Moreno-Coll, Christin FudgeAdlih, MD    Family  History No family history on file.  Social History Social History   Tobacco Use  . Smoking status: Not on file  Substance Use Topics  . Alcohol use: Not on file  . Drug use: Not on file     Allergies   Patient has no known allergies.   Review of Systems Review of Systems  Respiratory: Positive for chest tightness and shortness of breath.   Cardiovascular: Positive for chest pain.  All other systems reviewed and are negative.    Physical Exam Updated Vital Signs BP 132/84   Pulse (!) 112   Temp (!) 100.7 F (38.2 C) (Oral)   Resp 19   SpO2 100%   Physical Exam  Constitutional: She is oriented to person, place, and time. She appears well-developed and well-nourished. No distress.  HENT:  Head: Normocephalic and atraumatic.  Right Ear: Hearing normal.  Left Ear: Hearing normal.  Nose: Nose normal.  Mouth/Throat: Oropharynx is clear and moist and mucous membranes are normal.  Eyes: Pupils are equal, round, and reactive to light. Conjunctivae and EOM are normal.  Neck: Normal range of motion. Neck supple.  Cardiovascular: Regular rhythm, S1 normal and S2 normal. Tachycardia present. Exam reveals no gallop and no friction rub.  No murmur heard. Pulmonary/Chest: Effort normal and breath sounds normal. No respiratory distress. She exhibits no tenderness.  Abdominal: Soft. Normal appearance and bowel sounds are  normal. There is no hepatosplenomegaly. There is no tenderness. There is no rebound, no guarding, no tenderness at McBurney's point and negative Murphy's sign. No hernia.  Musculoskeletal: Normal range of motion.  Neurological: She is alert and oriented to person, place, and time. She has normal strength. No cranial nerve deficit or sensory deficit. Coordination normal. GCS eye subscore is 4. GCS verbal subscore is 5. GCS motor subscore is 6.  Skin: Skin is warm, dry and intact. No rash noted. No cyanosis.  Psychiatric: She has a normal mood and affect. Her speech is  normal and behavior is normal. Thought content normal.  Nursing note and vitals reviewed.    ED Treatments / Results  Labs (all labs ordered are listed, but only abnormal results are displayed) Labs Reviewed  CBC WITH DIFFERENTIAL/PLATELET - Abnormal; Notable for the following components:      Result Value   WBC 13.7 (*)    Neutro Abs 12.7 (*)    Lymphs Abs 0.5 (*)    All other components within normal limits  COMPREHENSIVE METABOLIC PANEL - Abnormal; Notable for the following components:   Glucose, Bld 110 (*)    All other components within normal limits  URINALYSIS, ROUTINE W REFLEX MICROSCOPIC - Abnormal; Notable for the following components:   Color, Urine COLORLESS (*)    Specific Gravity, Urine 1.001 (*)    All other components within normal limits  I-STAT CG4 LACTIC ACID, ED - Abnormal; Notable for the following components:   Lactic Acid, Venous 2.62 (*)    All other components within normal limits  CULTURE, BLOOD (ROUTINE X 2)  CULTURE, BLOOD (ROUTINE X 2)  URINE CULTURE  BRAIN NATRIURETIC PEPTIDE  D-DIMER, QUANTITATIVE (NOT AT [facility])  INFLUENZA PANEL BY PCR (TYPE A & B)  I-STAT TROPONIN, ED    EKG EKG Interpretation  Date/Time:  Wednesday May 06 2018 00:18:12 EST Ventricular Rate:  112 PR Interval:    QRS Duration: 98 QT Interval:  319 QTC Calculation: 436 R Axis:   78 Text Interpretation:  Sinus tachycardia Probable left atrial enlargement RSR' in V1 or V2, probably normal variant Nonspecific ST abnormality No significant change since last tracing Confirmed by Gilda Crease [phone]) on 05/06/2018 12:35:20 AM   Radiology Dg Chest 2 View  Result Date: 05/06/2018 CLINICAL DATA:  Fever.  Shortness of breath EXAM: CHEST - 2 VIEW COMPARISON:  July 20, 2008 FINDINGS: The heart size and mediastinal contours are within normal limits. Both lungs are clear. The visualized skeletal structures are unremarkable. IMPRESSION: No active cardiopulmonary  disease. Electronically Signed   By: Gerome Sam III M.D   On: 05/06/2018 01:58    Procedures Procedures (including critical care time)  Medications Ordered in ED Medications  sodium chloride 0.9 % bolus 1,000 mL (1,000 mLs Intravenous New Bag/Given 05/06/18 0215)  ceFEPIme (MAXIPIME) 2 g in sodium chloride 0.9 % 100 mL IVPB (has no administration in time range)  metroNIDAZOLE (FLAGYL) IVPB 500 mg (has no administration in time range)  vancomycin (VANCOCIN) IVPB 1000 mg/200 mL premix (has no administration in time range)  acetaminophen (TYLENOL) tablet 650 mg (650 mg Oral Given 05/06/18 0213)     Initial Impression / Assessment and Plan / ED Course  I have reviewed the triage vital signs and the nursing notes.  Pertinent labs & imaging results that were available during my care of the patient were reviewed by me and considered in my medical decision making (see chart for details).  Patient presents to the emergency department for evaluation of chest pain and shortness of breath.  Symptoms began this evening.  Patient noted to be tachycardic at arrival, found to be febrile.  She was unaware of the fever.  She does have a leukocytosis and elevated lactic acid.  She was therefore initiated on treatment for sepsis of unknown etiology.  Urinalysis does not show infection.  Chest x-ray does not show any evidence of pneumonia.  EKG at arrival does have diffuse nonspecific ST elevations present.  Reviewing previous EKGs, however, show this is similar pattern in the past, essentially unchanged.  Troponin negative.  D-dimer negative.  Patient reports that she received an infusion earlier today.  Patient and husband are not quite sure exactly what she got, however, they state that the medication was prescribed by her rheumatologist, Dr. Kathi Ludwig, for rheumatoid arthritis.  This is most likely something like Humira, therefore patient had increased risk for infection and sepsis.  She was initiated  on broad-spectrum antibiotic coverage and will require hospitalization.  Blood and urine cultures pending.  CRITICAL CARE Performed by: Gilda Crease   Total critical care time: 35 minutes  Critical care time was exclusive of separately billable procedures and treating other patients.  Critical care was necessary to treat or prevent imminent or life-threatening deterioration.  Critical care was time spent personally by me on the following activities: development of treatment plan with patient and/or surrogate as well as nursing, discussions with consultants, evaluation of patient's response to treatment, examination of patient, obtaining history from patient or surrogate, ordering and performing treatments and interventions, ordering and review of laboratory studies, ordering and review of radiographic studies, pulse oximetry and re-evaluation of patient's condition.   Final Clinical Impressions(s) / ED Diagnoses   Final diagnoses:  Sepsis, due to unspecified organism, unspecified whether acute organ dysfunction present [facility])    ED Discharge Orders    None       Blinda Leatherwood Canary Brim, MD 05/06/18 [phone]

## 2018-05-06 NOTE — ED Triage Notes (Signed)
Pt arrives to ED from home with complaints of shortness of breath and chest pain since 5pm this evening. EMS reports pt started feeling short of breath and having CP at 5pm today. Pt had CP 2 weeks ago but it went away on its own. Pt also feels warm to tough. oral temp of 100.7 . Pt given 324 aspirin and 1 nitro that did decrease chest pain. Pt placed in position of comfort with bed locked and lowered, call bell in reach.

## 2018-05-07 DIAGNOSIS — R0602 Shortness of breath: Secondary | ICD-10-CM | POA: Diagnosis not present

## 2018-05-07 DIAGNOSIS — R0789 Other chest pain: Secondary | ICD-10-CM | POA: Diagnosis not present

## 2018-05-07 LAB — CBC
HEMATOCRIT: 31 % — AB (ref 36.0–46.0)
Hemoglobin: 9.7 g/dL — ABNORMAL LOW (ref 12.0–15.0)
MCH: 26.6 pg (ref 26.0–34.0)
MCHC: 31.3 g/dL (ref 30.0–36.0)
MCV: 85.2 fL (ref 80.0–100.0)
NRBC: 0 % (ref 0.0–0.2)
PLATELETS: 165 10*3/uL (ref 150–400)
RBC: 3.64 MIL/uL — ABNORMAL LOW (ref 3.87–5.11)
RDW: 14.4 % (ref 11.5–15.5)
WBC: 5.8 10*3/uL (ref 4.0–10.5)

## 2018-05-07 LAB — URINE CULTURE: Culture: <10000 — AB

## 2018-05-07 LAB — PROCALCITONIN: PROCALCITONIN: 0.87 ng/mL

## 2018-05-07 MED ORDER — BENZONATATE 100 MG PO CAPS: 100.0000 mg | ORAL_CAPSULE | Freq: Three times a day (TID) | ORAL | 0 refills | Status: AC | PRN

## 2018-05-07 MED ORDER — AZITHROMYCIN 250 MG PO TABS: ORAL_TABLET | ORAL | 0 refills | Status: AC

## 2018-05-07 NOTE — Discharge Summary (Addendum)
Physician Discharge Summary  Vicki Vang YNW:[id number] DOB: 10-07-1950 DOA: 05/06/2018  PCP: [facility]  Admit date: 05/06/2018 Discharge date: 05/07/2018  Admitted From: Home Disposition:  Home  Recommendations for Outpatient Follow-up:  1. Follow up with PCP in 1-2 weeks  Discharge Condition:Improved CODE STATUS:Full Diet recommendation: Regular   Brief/Interim Summary: 67 y.o.femalewith medical history significant ofhypertension comes in with 1 day of shortness of breath cough and chest pain. Patient reports chest pain is pleuritic in nature. She speaks some English a lot of the history is obtained from her husband. She has not had any nausea vomiting or diarrhea. She has not had any recent traveling in over a year. She denies any recent injuries any broken bones or any significant illnesses recently. She is on some sort of infusion for her arthritis by a rheumatologist. Unclear what infusion she is on so she may be immunocompromised. Patient is being referred for admission for her chest pain and fever with a lactic acid level of 2.6.   Principal Problem: Chest pain-chest pain likely secondary to chest wall pain -atypical in nature -Serial trop neg x 4 -2d echo performed with no WMA and normal LVEF -Suspect chest pain related to chest wall pain in setting of viral illness -Continue analgesics as needed  Active Problems: SOB (shortness of breath)-with viral URI and sepsis present on presentation -flu neg -Ordered and reviewed viral panel, negative -Clinically improving -Fevers resolved, no longer tachycardic -Complete course of empiric azithromycin  HTN -BP remained stable  Discharge Diagnoses:  Principal Problem:   Chest pain Active Problems:   SOB (shortness of breath)  Discharge Instructions   Allergies as of 05/07/2018   No Known Allergies     Medication List    STOP taking these medications   Salicylic Acid 0.5 % Soln    Triamcinolone Acetonide 0.025 % Lotn     TAKE these medications   acetaminophen 325 MG tablet Commonly known as:  TYLENOL Take 650 mg by mouth every 6 (six) hours as needed for mild pain.   alendronate 70 MG tablet Commonly known as:  FOSAMAX Take 70 mg by mouth once a week.   azithromycin 250 MG tablet Commonly known as:  ZITHROMAX Take one tab PO daily x 3 days, zero refills   benzonatate 100 MG capsule Commonly known as:  TESSALON Take 1 capsule (100 mg total) by mouth 3 (three) times daily as needed for cough.   lisinopril 20 MG tablet Commonly known as:  PRINIVIL,ZESTRIL Take 20 mg by mouth daily.   meloxicam 15 MG tablet Commonly known as:  MOBIC Take 15 mg by mouth daily.   pantoprazole 40 MG tablet Commonly known as:  PROTONIX Take 40 mg by mouth daily.   pravastatin 20 MG tablet Commonly known as:  PRAVACHOL Take 20 mg by mouth daily.      Follow-up Information    [facility]. Schedule an appointment as soon as possible for a visit in 2 week(s).   Specialty:  Rheumatology Contact information: [address] [place] [address] [phone]          No Known Allergies   Procedures/Studies: Dg Chest 2 View  Result Date: 05/06/2018 CLINICAL DATA:  Fever.  Shortness of breath EXAM: CHEST - 2 VIEW COMPARISON:  July 20, 2008 FINDINGS: The heart size and mediastinal contours are within normal limits. Both lungs are clear. The visualized skeletal structures are unremarkable. IMPRESSION: No active cardiopulmonary disease. Electronically Signed   By: Onalee Hua  Mayford Knife III M.D   On: 05/06/2018 01:58     Subjective: Eager to go home  Discharge Exam: Vitals:   05/06/18 2115 05/07/18 0432  BP: 92/63 126/75  Pulse:  89  Resp:  16  Temp:  99.2 F (37.3 C)  SpO2:  98%   Vitals:   05/06/18 1240 05/06/18 2009 05/06/18 2115 05/07/18 0432  BP: 129/76 (!) 90/53 92/63 126/75  Pulse: 99 84  89  Resp:  20  16  Temp: 98.5 F  (36.9 C) 98.9 F (37.2 C)  99.2 F (37.3 C)  TempSrc: Oral     SpO2: 99% 98%  98%  Weight:    54.5 kg    General: Pt is alert, awake, not in acute distress Cardiovascular: RRR, S1/S2 +, no rubs, no gallops Respiratory: CTA bilaterally, no wheezing, no rhonchi Abdominal: Soft, NT, ND, bowel sounds + Extremities: no edema, no cyanosis   The results of significant diagnostics from this hospitalization (including imaging, microbiology, ancillary and laboratory) are listed below for reference.     Microbiology: Recent Results (from the past 240 hour(s))  Urine Culture     Status: Abnormal   Collection Time: 05/06/18  1:35 AM  Result Value Ref Range Status   Specimen Description URINE, CLEAN CATCH  Final   Special Requests NONE  Final   Culture (A)  Final    <10,000 COLONIES/mL INSIGNIFICANT GROWTH Performed at [facility], [address] [place], [place] [address]    Report Status 05/07/2018 FINAL  Final  Respiratory Panel by PCR     Status: None   Collection Time: 05/06/18 10:30 AM  Result Value Ref Range Status   Adenovirus NOT DETECTED NOT DETECTED Final   Coronavirus 229E NOT DETECTED NOT DETECTED Final   Coronavirus HKU1 NOT DETECTED NOT DETECTED Final   Coronavirus NL63 NOT DETECTED NOT DETECTED Final   Coronavirus OC43 NOT DETECTED NOT DETECTED Final   Metapneumovirus NOT DETECTED NOT DETECTED Final   Rhinovirus / Enterovirus NOT DETECTED NOT DETECTED Final   Influenza A NOT DETECTED NOT DETECTED Final   Influenza B NOT DETECTED NOT DETECTED Final   Parainfluenza Virus 1 NOT DETECTED NOT DETECTED Final   Parainfluenza Virus 2 NOT DETECTED NOT DETECTED Final   Parainfluenza Virus 3 NOT DETECTED NOT DETECTED Final   Parainfluenza Virus 4 NOT DETECTED NOT DETECTED Final   Respiratory Syncytial Virus NOT DETECTED NOT DETECTED Final   Bordetella pertussis NOT DETECTED NOT DETECTED Final   Chlamydophila pneumoniae NOT DETECTED NOT DETECTED Final   Mycoplasma  pneumoniae NOT DETECTED NOT DETECTED Final    Comment: Performed at [facility], [address], [place], [place] [address]     Labs: BNP (last 3 results) Recent Labs    05/06/18 0055  BNP 33.0   Basic Metabolic Panel: Recent Labs  Lab 05/06/18 0055  NA 136  K 3.8  CL 106  CO2 22  GLUCOSE 110*  BUN 12  CREATININE 0.77  CALCIUM 9.2   Liver Function Tests: Recent Labs  Lab 05/06/18 0055  AST 24  ALT 9  ALKPHOS 63  BILITOT 0.7  PROT 7.0  ALBUMIN 4.0   No results for input(s): LIPASE, AMYLASE in the last 168 hours. No results for input(s): AMMONIA in the last 168 hours. CBC: Recent Labs  Lab 05/06/18 0055 05/07/18 0415  WBC 13.7* 5.8  NEUTROABS 12.7*  --   HGB 12.0 9.7*  HCT 37.6 31.0*  MCV 84.3 85.2  PLT 206 165   Cardiac Enzymes: Recent Labs  Lab 05/06/18 0318 05/06/18 0529 05/06/18 0949 05/06/18 1305  TROPONINI <0.03 <0.03 <0.03 <0.03   BNP: Invalid input(s): POCBNP CBG: No results for input(s): GLUCAP in the last 168 hours. D-Dimer Recent Labs    05/06/18 0055  DDIMER <0.27   Hgb A1c No results for input(s): HGBA1C in the last 72 hours. Lipid Profile No results for input(s): CHOL, HDL, LDLCALC, TRIG, CHOLHDL, LDLDIRECT in the last 72 hours. Thyroid function studies No results for input(s): TSH, T4TOTAL, T3FREE, THYROIDAB in the last 72 hours.  Invalid input(s): FREET3 Anemia work up No results for input(s): VITAMINB12, FOLATE, FERRITIN, TIBC, IRON, RETICCTPCT in the last 72 hours. Urinalysis    Component Value Date/Time   COLORURINE COLORLESS (A) 05/06/2018 0135   APPEARANCEUR CLEAR 05/06/2018 0135   LABSPEC 1.001 (L) 05/06/2018 0135   PHURINE 8.0 05/06/2018 0135   GLUCOSEU NEGATIVE 05/06/2018 0135   HGBUR NEGATIVE 05/06/2018 0135   BILIRUBINUR NEGATIVE 05/06/2018 0135   KETONESUR NEGATIVE 05/06/2018 0135   PROTEINUR NEGATIVE 05/06/2018 0135   UROBILINOGEN 0.2 12/30/2011 1154   NITRITE NEGATIVE 05/06/2018 0135    LEUKOCYTESUR NEGATIVE 05/06/2018 0135   Sepsis Labs Invalid input(s): PROCALCITONIN,  WBC,  LACTICIDVEN Microbiology Recent Results (from the past 240 hour(s))  Urine Culture     Status: Abnormal   Collection Time: 05/06/18  1:35 AM  Result Value Ref Range Status   Specimen Description URINE, CLEAN CATCH  Final   Special Requests NONE  Final   Culture (A)  Final    <10,000 COLONIES/mL INSIGNIFICANT GROWTH Performed at [facility], [address] [place], [place] [id number]    Report Status 05/07/2018 FINAL  Final  Respiratory Panel by PCR     Status: None   Collection Time: 05/06/18 10:30 AM  Result Value Ref Range Status   Adenovirus NOT DETECTED NOT DETECTED Final   Coronavirus 229E NOT DETECTED NOT DETECTED Final   Coronavirus HKU1 NOT DETECTED NOT DETECTED Final   Coronavirus NL63 NOT DETECTED NOT DETECTED Final   Coronavirus OC43 NOT DETECTED NOT DETECTED Final   Metapneumovirus NOT DETECTED NOT DETECTED Final   Rhinovirus / Enterovirus NOT DETECTED NOT DETECTED Final   Influenza A NOT DETECTED NOT DETECTED Final   Influenza B NOT DETECTED NOT DETECTED Final   Parainfluenza Virus 1 NOT DETECTED NOT DETECTED Final   Parainfluenza Virus 2 NOT DETECTED NOT DETECTED Final   Parainfluenza Virus 3 NOT DETECTED NOT DETECTED Final   Parainfluenza Virus 4 NOT DETECTED NOT DETECTED Final   Respiratory Syncytial Virus NOT DETECTED NOT DETECTED Final   Bordetella pertussis NOT DETECTED NOT DETECTED Final   Chlamydophila pneumoniae NOT DETECTED NOT DETECTED Final   Mycoplasma pneumoniae NOT DETECTED NOT DETECTED Final    Comment: Performed at [facility], [address] [place], [place] [address]   Time spent: 30 min  SIGNED:   Rickey BarbaraStephen Ewell Benassi, MD  [facility] 05/07/2018, 9:42 AM  If 7PM-7AM, please contact night-coverage

## 2018-05-11 LAB — CULTURE, BLOOD (ROUTINE X 2)
CULTURE: NO GROWTH
Culture: NO GROWTH
Special Requests: ADEQUATE

## 2018-05-12 DIAGNOSIS — M1990 Unspecified osteoarthritis, unspecified site: Secondary | ICD-10-CM | POA: Diagnosis not present

## 2018-05-12 DIAGNOSIS — M545 Low back pain: Secondary | ICD-10-CM | POA: Diagnosis not present

## 2018-05-12 DIAGNOSIS — M79671 Pain in right foot: Secondary | ICD-10-CM | POA: Diagnosis not present

## 2019-02-27 DIAGNOSIS — Z23 Encounter for immunization: Secondary | ICD-10-CM | POA: Diagnosis not present
